# Patient Record
Sex: Female | Born: 1945
Health system: Southern US, Community
[De-identification: ages and names within clinical notes are randomized; demographics above are authoritative.]

## PROBLEM LIST (undated history)

## (undated) DIAGNOSIS — I10 Essential (primary) hypertension: Secondary | ICD-10-CM

---

## 2012-04-19 DIAGNOSIS — I10 Essential (primary) hypertension: Secondary | ICD-10-CM | POA: Diagnosis not present

## 2012-07-13 DIAGNOSIS — J209 Acute bronchitis, unspecified: Secondary | ICD-10-CM | POA: Diagnosis not present

## 2012-10-01 DIAGNOSIS — L02818 Cutaneous abscess of other sites: Secondary | ICD-10-CM | POA: Diagnosis not present

## 2012-10-01 DIAGNOSIS — L03818 Cellulitis of other sites: Secondary | ICD-10-CM | POA: Diagnosis not present

## 2013-06-02 DIAGNOSIS — I10 Essential (primary) hypertension: Secondary | ICD-10-CM | POA: Diagnosis not present

## 2013-09-08 DIAGNOSIS — Z6827 Body mass index (BMI) 27.0-27.9, adult: Secondary | ICD-10-CM | POA: Diagnosis not present

## 2013-09-08 DIAGNOSIS — I10 Essential (primary) hypertension: Secondary | ICD-10-CM | POA: Diagnosis not present

## 2013-12-12 DIAGNOSIS — Z0000 Encounter for general adult medical examination without abnormal findings: Secondary | ICD-10-CM | POA: Diagnosis not present

## 2013-12-12 DIAGNOSIS — Z6827 Body mass index (BMI) 27.0-27.9, adult: Secondary | ICD-10-CM | POA: Diagnosis not present

## 2013-12-12 DIAGNOSIS — I10 Essential (primary) hypertension: Secondary | ICD-10-CM | POA: Diagnosis not present

## 2013-12-12 DIAGNOSIS — Z131 Encounter for screening for diabetes mellitus: Secondary | ICD-10-CM | POA: Diagnosis not present

## 2015-11-22 DIAGNOSIS — I10 Essential (primary) hypertension: Secondary | ICD-10-CM | POA: Diagnosis not present

## 2015-11-22 DIAGNOSIS — Z1389 Encounter for screening for other disorder: Secondary | ICD-10-CM | POA: Diagnosis not present

## 2015-11-22 DIAGNOSIS — Z0000 Encounter for general adult medical examination without abnormal findings: Secondary | ICD-10-CM | POA: Diagnosis not present

## 2016-02-29 DIAGNOSIS — K210 Gastro-esophageal reflux disease with esophagitis: Secondary | ICD-10-CM | POA: Diagnosis not present

## 2016-02-29 DIAGNOSIS — I10 Essential (primary) hypertension: Secondary | ICD-10-CM | POA: Diagnosis not present

## 2016-12-31 DIAGNOSIS — E876 Hypokalemia: Secondary | ICD-10-CM | POA: Diagnosis not present

## 2016-12-31 DIAGNOSIS — I639 Cerebral infarction, unspecified: Secondary | ICD-10-CM | POA: Diagnosis not present

## 2016-12-31 DIAGNOSIS — G510 Bell's palsy: Secondary | ICD-10-CM | POA: Diagnosis not present

## 2016-12-31 DIAGNOSIS — D539 Nutritional anemia, unspecified: Secondary | ICD-10-CM | POA: Diagnosis not present

## 2016-12-31 DIAGNOSIS — I10 Essential (primary) hypertension: Secondary | ICD-10-CM | POA: Diagnosis not present

## 2016-12-31 DIAGNOSIS — R4781 Slurred speech: Secondary | ICD-10-CM | POA: Diagnosis not present

## 2016-12-31 DIAGNOSIS — I613 Nontraumatic intracerebral hemorrhage in brain stem: Secondary | ICD-10-CM | POA: Diagnosis not present

## 2016-12-31 DIAGNOSIS — G8194 Hemiplegia, unspecified affecting left nondominant side: Secondary | ICD-10-CM | POA: Diagnosis not present

## 2016-12-31 DIAGNOSIS — I160 Hypertensive urgency: Secondary | ICD-10-CM | POA: Diagnosis not present

## 2016-12-31 DIAGNOSIS — R29810 Facial weakness: Secondary | ICD-10-CM | POA: Diagnosis not present

## 2016-12-31 DIAGNOSIS — I615 Nontraumatic intracerebral hemorrhage, intraventricular: Secondary | ICD-10-CM | POA: Diagnosis not present

## 2016-12-31 DIAGNOSIS — I161 Hypertensive emergency: Secondary | ICD-10-CM | POA: Diagnosis not present

## 2016-12-31 DIAGNOSIS — F329 Major depressive disorder, single episode, unspecified: Secondary | ICD-10-CM | POA: Diagnosis not present

## 2016-12-31 DIAGNOSIS — R29818 Other symptoms and signs involving the nervous system: Secondary | ICD-10-CM | POA: Diagnosis not present

## 2016-12-31 DIAGNOSIS — I6789 Other cerebrovascular disease: Secondary | ICD-10-CM | POA: Diagnosis not present

## 2017-01-01 DIAGNOSIS — I10 Essential (primary) hypertension: Secondary | ICD-10-CM | POA: Diagnosis not present

## 2017-01-01 DIAGNOSIS — I613 Nontraumatic intracerebral hemorrhage in brain stem: Secondary | ICD-10-CM | POA: Diagnosis not present

## 2017-01-01 DIAGNOSIS — G529 Cranial nerve disorder, unspecified: Secondary | ICD-10-CM | POA: Diagnosis not present

## 2017-01-01 DIAGNOSIS — K5900 Constipation, unspecified: Secondary | ICD-10-CM | POA: Diagnosis not present

## 2017-01-01 DIAGNOSIS — H02401 Unspecified ptosis of right eyelid: Secondary | ICD-10-CM | POA: Diagnosis present

## 2017-01-01 DIAGNOSIS — R079 Chest pain, unspecified: Secondary | ICD-10-CM | POA: Diagnosis not present

## 2017-01-01 DIAGNOSIS — D649 Anemia, unspecified: Secondary | ICD-10-CM | POA: Diagnosis not present

## 2017-01-01 DIAGNOSIS — F329 Major depressive disorder, single episode, unspecified: Secondary | ICD-10-CM | POA: Diagnosis not present

## 2017-01-01 DIAGNOSIS — I6781 Acute cerebrovascular insufficiency: Secondary | ICD-10-CM | POA: Diagnosis not present

## 2017-01-01 DIAGNOSIS — R29810 Facial weakness: Secondary | ICD-10-CM | POA: Diagnosis not present

## 2017-01-01 DIAGNOSIS — D539 Nutritional anemia, unspecified: Secondary | ICD-10-CM | POA: Diagnosis present

## 2017-01-01 DIAGNOSIS — E876 Hypokalemia: Secondary | ICD-10-CM | POA: Diagnosis not present

## 2017-01-01 DIAGNOSIS — R9431 Abnormal electrocardiogram [ECG] [EKG]: Secondary | ICD-10-CM | POA: Diagnosis not present

## 2017-01-01 DIAGNOSIS — H538 Other visual disturbances: Secondary | ICD-10-CM | POA: Diagnosis not present

## 2017-01-01 DIAGNOSIS — R41841 Cognitive communication deficit: Secondary | ICD-10-CM | POA: Diagnosis not present

## 2017-01-01 DIAGNOSIS — R1310 Dysphagia, unspecified: Secondary | ICD-10-CM | POA: Diagnosis not present

## 2017-01-01 DIAGNOSIS — I615 Nontraumatic intracerebral hemorrhage, intraventricular: Secondary | ICD-10-CM | POA: Diagnosis not present

## 2017-01-01 DIAGNOSIS — Z7409 Other reduced mobility: Secondary | ICD-10-CM | POA: Diagnosis not present

## 2017-01-01 DIAGNOSIS — Z9114 Patient's other noncompliance with medication regimen: Secondary | ICD-10-CM | POA: Diagnosis not present

## 2017-01-01 DIAGNOSIS — I161 Hypertensive emergency: Secondary | ICD-10-CM | POA: Diagnosis not present

## 2017-01-01 DIAGNOSIS — G510 Bell's palsy: Secondary | ICD-10-CM | POA: Diagnosis present

## 2017-01-01 DIAGNOSIS — H510 Palsy (spasm) of conjugate gaze: Secondary | ICD-10-CM | POA: Diagnosis not present

## 2017-01-07 DIAGNOSIS — F329 Major depressive disorder, single episode, unspecified: Secondary | ICD-10-CM | POA: Diagnosis present

## 2017-01-07 DIAGNOSIS — I613 Nontraumatic intracerebral hemorrhage in brain stem: Secondary | ICD-10-CM | POA: Diagnosis not present

## 2017-01-07 DIAGNOSIS — I10 Essential (primary) hypertension: Secondary | ICD-10-CM | POA: Diagnosis not present

## 2017-01-07 DIAGNOSIS — H02206 Unspecified lagophthalmos left eye, unspecified eyelid: Secondary | ICD-10-CM | POA: Diagnosis not present

## 2017-01-07 DIAGNOSIS — I69198 Other sequelae of nontraumatic intracerebral hemorrhage: Secondary | ICD-10-CM | POA: Diagnosis not present

## 2017-01-07 DIAGNOSIS — R41841 Cognitive communication deficit: Secondary | ICD-10-CM | POA: Diagnosis not present

## 2017-01-07 DIAGNOSIS — I69193 Ataxia following nontraumatic intracerebral hemorrhage: Secondary | ICD-10-CM | POA: Diagnosis not present

## 2017-01-07 DIAGNOSIS — I69134 Monoplegia of upper limb following nontraumatic intracerebral hemorrhage affecting left non-dominant side: Secondary | ICD-10-CM | POA: Diagnosis not present

## 2017-01-07 DIAGNOSIS — H547 Unspecified visual loss: Secondary | ICD-10-CM | POA: Diagnosis present

## 2017-01-07 DIAGNOSIS — H02401 Unspecified ptosis of right eyelid: Secondary | ICD-10-CM | POA: Diagnosis present

## 2017-01-07 DIAGNOSIS — R079 Chest pain, unspecified: Secondary | ICD-10-CM | POA: Diagnosis not present

## 2017-01-07 DIAGNOSIS — H4922 Sixth [abducent] nerve palsy, left eye: Secondary | ICD-10-CM | POA: Diagnosis present

## 2017-01-07 DIAGNOSIS — G510 Bell's palsy: Secondary | ICD-10-CM | POA: Diagnosis present

## 2017-01-07 DIAGNOSIS — R1310 Dysphagia, unspecified: Secondary | ICD-10-CM | POA: Diagnosis not present

## 2017-01-07 DIAGNOSIS — I69192 Facial weakness following nontraumatic intracerebral hemorrhage: Secondary | ICD-10-CM | POA: Diagnosis not present

## 2017-01-07 DIAGNOSIS — I69191 Dysphagia following nontraumatic intracerebral hemorrhage: Secondary | ICD-10-CM | POA: Diagnosis not present

## 2017-01-07 DIAGNOSIS — R419 Unspecified symptoms and signs involving cognitive functions and awareness: Secondary | ICD-10-CM | POA: Diagnosis not present

## 2017-01-07 DIAGNOSIS — E876 Hypokalemia: Secondary | ICD-10-CM | POA: Diagnosis not present

## 2017-01-07 DIAGNOSIS — H538 Other visual disturbances: Secondary | ICD-10-CM | POA: Diagnosis present

## 2017-01-07 DIAGNOSIS — I69118 Other symptoms and signs involving cognitive functions following nontraumatic intracerebral hemorrhage: Secondary | ICD-10-CM | POA: Diagnosis not present

## 2017-01-07 DIAGNOSIS — D649 Anemia, unspecified: Secondary | ICD-10-CM | POA: Diagnosis not present

## 2017-01-07 DIAGNOSIS — Z7409 Other reduced mobility: Secondary | ICD-10-CM | POA: Diagnosis not present

## 2017-01-07 DIAGNOSIS — I6911 Attention and concentration deficit following nontraumatic intracerebral hemorrhage: Secondary | ICD-10-CM | POA: Diagnosis not present

## 2017-01-07 DIAGNOSIS — K5900 Constipation, unspecified: Secondary | ICD-10-CM | POA: Diagnosis not present

## 2017-01-07 DIAGNOSIS — K2900 Acute gastritis without bleeding: Secondary | ICD-10-CM | POA: Diagnosis not present

## 2017-01-07 DIAGNOSIS — R414 Neurologic neglect syndrome: Secondary | ICD-10-CM | POA: Diagnosis present

## 2017-01-07 DIAGNOSIS — R0789 Other chest pain: Secondary | ICD-10-CM | POA: Diagnosis present

## 2017-01-07 DIAGNOSIS — K219 Gastro-esophageal reflux disease without esophagitis: Secondary | ICD-10-CM | POA: Diagnosis not present

## 2017-01-07 DIAGNOSIS — I69122 Dysarthria following nontraumatic intracerebral hemorrhage: Secondary | ICD-10-CM | POA: Diagnosis not present

## 2017-01-07 DIAGNOSIS — I615 Nontraumatic intracerebral hemorrhage, intraventricular: Secondary | ICD-10-CM | POA: Diagnosis not present

## 2017-01-20 DIAGNOSIS — H547 Unspecified visual loss: Secondary | ICD-10-CM | POA: Diagnosis not present

## 2017-01-20 DIAGNOSIS — I10 Essential (primary) hypertension: Secondary | ICD-10-CM | POA: Diagnosis not present

## 2017-01-20 DIAGNOSIS — I69191 Dysphagia following nontraumatic intracerebral hemorrhage: Secondary | ICD-10-CM | POA: Diagnosis not present

## 2017-01-20 DIAGNOSIS — R1310 Dysphagia, unspecified: Secondary | ICD-10-CM | POA: Diagnosis not present

## 2017-01-20 DIAGNOSIS — I69118 Other symptoms and signs involving cognitive functions following nontraumatic intracerebral hemorrhage: Secondary | ICD-10-CM | POA: Diagnosis not present

## 2017-01-20 DIAGNOSIS — I69198 Other sequelae of nontraumatic intracerebral hemorrhage: Secondary | ICD-10-CM | POA: Diagnosis not present

## 2017-01-21 DIAGNOSIS — I69198 Other sequelae of nontraumatic intracerebral hemorrhage: Secondary | ICD-10-CM | POA: Diagnosis not present

## 2017-01-21 DIAGNOSIS — I10 Essential (primary) hypertension: Secondary | ICD-10-CM | POA: Diagnosis not present

## 2017-01-21 DIAGNOSIS — H547 Unspecified visual loss: Secondary | ICD-10-CM | POA: Diagnosis not present

## 2017-01-21 DIAGNOSIS — I69191 Dysphagia following nontraumatic intracerebral hemorrhage: Secondary | ICD-10-CM | POA: Diagnosis not present

## 2017-01-21 DIAGNOSIS — R1310 Dysphagia, unspecified: Secondary | ICD-10-CM | POA: Diagnosis not present

## 2017-01-21 DIAGNOSIS — I69118 Other symptoms and signs involving cognitive functions following nontraumatic intracerebral hemorrhage: Secondary | ICD-10-CM | POA: Diagnosis not present

## 2017-01-23 DIAGNOSIS — K210 Gastro-esophageal reflux disease with esophagitis: Secondary | ICD-10-CM | POA: Diagnosis not present

## 2017-01-23 DIAGNOSIS — Z1389 Encounter for screening for other disorder: Secondary | ICD-10-CM | POA: Diagnosis not present

## 2017-01-23 DIAGNOSIS — I10 Essential (primary) hypertension: Secondary | ICD-10-CM | POA: Diagnosis not present

## 2017-01-23 DIAGNOSIS — Z0000 Encounter for general adult medical examination without abnormal findings: Secondary | ICD-10-CM | POA: Diagnosis not present

## 2017-01-23 DIAGNOSIS — I6349 Cerebral infarction due to embolism of other cerebral artery: Secondary | ICD-10-CM | POA: Diagnosis not present

## 2017-01-27 DIAGNOSIS — I69198 Other sequelae of nontraumatic intracerebral hemorrhage: Secondary | ICD-10-CM | POA: Diagnosis not present

## 2017-01-27 DIAGNOSIS — H547 Unspecified visual loss: Secondary | ICD-10-CM | POA: Diagnosis not present

## 2017-01-27 DIAGNOSIS — I69118 Other symptoms and signs involving cognitive functions following nontraumatic intracerebral hemorrhage: Secondary | ICD-10-CM | POA: Diagnosis not present

## 2017-01-27 DIAGNOSIS — I10 Essential (primary) hypertension: Secondary | ICD-10-CM | POA: Diagnosis not present

## 2017-01-27 DIAGNOSIS — R1310 Dysphagia, unspecified: Secondary | ICD-10-CM | POA: Diagnosis not present

## 2017-01-27 DIAGNOSIS — I69191 Dysphagia following nontraumatic intracerebral hemorrhage: Secondary | ICD-10-CM | POA: Diagnosis not present

## 2017-01-30 DIAGNOSIS — I69118 Other symptoms and signs involving cognitive functions following nontraumatic intracerebral hemorrhage: Secondary | ICD-10-CM | POA: Diagnosis not present

## 2017-01-30 DIAGNOSIS — I69198 Other sequelae of nontraumatic intracerebral hemorrhage: Secondary | ICD-10-CM | POA: Diagnosis not present

## 2017-01-30 DIAGNOSIS — H547 Unspecified visual loss: Secondary | ICD-10-CM | POA: Diagnosis not present

## 2017-01-30 DIAGNOSIS — I69191 Dysphagia following nontraumatic intracerebral hemorrhage: Secondary | ICD-10-CM | POA: Diagnosis not present

## 2017-01-30 DIAGNOSIS — I10 Essential (primary) hypertension: Secondary | ICD-10-CM | POA: Diagnosis not present

## 2017-01-30 DIAGNOSIS — R1310 Dysphagia, unspecified: Secondary | ICD-10-CM | POA: Diagnosis not present

## 2017-02-03 DIAGNOSIS — I10 Essential (primary) hypertension: Secondary | ICD-10-CM | POA: Diagnosis not present

## 2017-02-03 DIAGNOSIS — I69191 Dysphagia following nontraumatic intracerebral hemorrhage: Secondary | ICD-10-CM | POA: Diagnosis not present

## 2017-02-03 DIAGNOSIS — I69118 Other symptoms and signs involving cognitive functions following nontraumatic intracerebral hemorrhage: Secondary | ICD-10-CM | POA: Diagnosis not present

## 2017-02-03 DIAGNOSIS — I69198 Other sequelae of nontraumatic intracerebral hemorrhage: Secondary | ICD-10-CM | POA: Diagnosis not present

## 2017-02-03 DIAGNOSIS — R1310 Dysphagia, unspecified: Secondary | ICD-10-CM | POA: Diagnosis not present

## 2017-02-03 DIAGNOSIS — H547 Unspecified visual loss: Secondary | ICD-10-CM | POA: Diagnosis not present

## 2017-02-04 DIAGNOSIS — R1310 Dysphagia, unspecified: Secondary | ICD-10-CM | POA: Diagnosis not present

## 2017-02-04 DIAGNOSIS — I69118 Other symptoms and signs involving cognitive functions following nontraumatic intracerebral hemorrhage: Secondary | ICD-10-CM | POA: Diagnosis not present

## 2017-02-04 DIAGNOSIS — I10 Essential (primary) hypertension: Secondary | ICD-10-CM | POA: Diagnosis not present

## 2017-02-04 DIAGNOSIS — I69191 Dysphagia following nontraumatic intracerebral hemorrhage: Secondary | ICD-10-CM | POA: Diagnosis not present

## 2017-02-04 DIAGNOSIS — I69198 Other sequelae of nontraumatic intracerebral hemorrhage: Secondary | ICD-10-CM | POA: Diagnosis not present

## 2017-02-04 DIAGNOSIS — H547 Unspecified visual loss: Secondary | ICD-10-CM | POA: Diagnosis not present

## 2017-02-05 DIAGNOSIS — I69198 Other sequelae of nontraumatic intracerebral hemorrhage: Secondary | ICD-10-CM | POA: Diagnosis not present

## 2017-02-05 DIAGNOSIS — R1310 Dysphagia, unspecified: Secondary | ICD-10-CM | POA: Diagnosis not present

## 2017-02-05 DIAGNOSIS — I10 Essential (primary) hypertension: Secondary | ICD-10-CM | POA: Diagnosis not present

## 2017-02-05 DIAGNOSIS — I69191 Dysphagia following nontraumatic intracerebral hemorrhage: Secondary | ICD-10-CM | POA: Diagnosis not present

## 2017-02-05 DIAGNOSIS — H547 Unspecified visual loss: Secondary | ICD-10-CM | POA: Diagnosis not present

## 2017-02-05 DIAGNOSIS — I69118 Other symptoms and signs involving cognitive functions following nontraumatic intracerebral hemorrhage: Secondary | ICD-10-CM | POA: Diagnosis not present

## 2017-02-10 DIAGNOSIS — I10 Essential (primary) hypertension: Secondary | ICD-10-CM | POA: Diagnosis not present

## 2017-02-10 DIAGNOSIS — Z7409 Other reduced mobility: Secondary | ICD-10-CM | POA: Diagnosis not present

## 2017-02-10 DIAGNOSIS — R4189 Other symptoms and signs involving cognitive functions and awareness: Secondary | ICD-10-CM | POA: Diagnosis not present

## 2017-02-10 DIAGNOSIS — R269 Unspecified abnormalities of gait and mobility: Secondary | ICD-10-CM | POA: Diagnosis not present

## 2017-02-10 DIAGNOSIS — Z79899 Other long term (current) drug therapy: Secondary | ICD-10-CM | POA: Diagnosis not present

## 2017-02-10 DIAGNOSIS — R29818 Other symptoms and signs involving the nervous system: Secondary | ICD-10-CM | POA: Diagnosis not present

## 2017-02-11 DIAGNOSIS — I69118 Other symptoms and signs involving cognitive functions following nontraumatic intracerebral hemorrhage: Secondary | ICD-10-CM | POA: Diagnosis not present

## 2017-02-11 DIAGNOSIS — I69191 Dysphagia following nontraumatic intracerebral hemorrhage: Secondary | ICD-10-CM | POA: Diagnosis not present

## 2017-02-11 DIAGNOSIS — R1310 Dysphagia, unspecified: Secondary | ICD-10-CM | POA: Diagnosis not present

## 2017-02-11 DIAGNOSIS — I69198 Other sequelae of nontraumatic intracerebral hemorrhage: Secondary | ICD-10-CM | POA: Diagnosis not present

## 2017-02-11 DIAGNOSIS — I10 Essential (primary) hypertension: Secondary | ICD-10-CM | POA: Diagnosis not present

## 2017-02-11 DIAGNOSIS — H547 Unspecified visual loss: Secondary | ICD-10-CM | POA: Diagnosis not present

## 2017-02-12 DIAGNOSIS — H547 Unspecified visual loss: Secondary | ICD-10-CM | POA: Diagnosis not present

## 2017-02-12 DIAGNOSIS — I69118 Other symptoms and signs involving cognitive functions following nontraumatic intracerebral hemorrhage: Secondary | ICD-10-CM | POA: Diagnosis not present

## 2017-02-12 DIAGNOSIS — I69198 Other sequelae of nontraumatic intracerebral hemorrhage: Secondary | ICD-10-CM | POA: Diagnosis not present

## 2017-02-12 DIAGNOSIS — I69191 Dysphagia following nontraumatic intracerebral hemorrhage: Secondary | ICD-10-CM | POA: Diagnosis not present

## 2017-02-12 DIAGNOSIS — I10 Essential (primary) hypertension: Secondary | ICD-10-CM | POA: Diagnosis not present

## 2017-02-12 DIAGNOSIS — R1310 Dysphagia, unspecified: Secondary | ICD-10-CM | POA: Diagnosis not present

## 2017-02-13 DIAGNOSIS — I10 Essential (primary) hypertension: Secondary | ICD-10-CM | POA: Diagnosis not present

## 2017-02-13 DIAGNOSIS — I69191 Dysphagia following nontraumatic intracerebral hemorrhage: Secondary | ICD-10-CM | POA: Diagnosis not present

## 2017-02-13 DIAGNOSIS — R1310 Dysphagia, unspecified: Secondary | ICD-10-CM | POA: Diagnosis not present

## 2017-02-13 DIAGNOSIS — H547 Unspecified visual loss: Secondary | ICD-10-CM | POA: Diagnosis not present

## 2017-02-13 DIAGNOSIS — I69118 Other symptoms and signs involving cognitive functions following nontraumatic intracerebral hemorrhage: Secondary | ICD-10-CM | POA: Diagnosis not present

## 2017-02-13 DIAGNOSIS — I69198 Other sequelae of nontraumatic intracerebral hemorrhage: Secondary | ICD-10-CM | POA: Diagnosis not present

## 2017-04-06 DIAGNOSIS — H02206 Unspecified lagophthalmos left eye, unspecified eyelid: Secondary | ICD-10-CM | POA: Diagnosis not present

## 2017-04-06 DIAGNOSIS — H4922 Sixth [abducent] nerve palsy, left eye: Secondary | ICD-10-CM | POA: Diagnosis not present

## 2017-04-06 DIAGNOSIS — H40003 Preglaucoma, unspecified, bilateral: Secondary | ICD-10-CM | POA: Diagnosis not present

## 2017-04-06 DIAGNOSIS — I10 Essential (primary) hypertension: Secondary | ICD-10-CM | POA: Diagnosis not present

## 2017-04-06 DIAGNOSIS — Z8673 Personal history of transient ischemic attack (TIA), and cerebral infarction without residual deficits: Secondary | ICD-10-CM | POA: Diagnosis not present

## 2017-04-06 DIAGNOSIS — Z79899 Other long term (current) drug therapy: Secondary | ICD-10-CM | POA: Diagnosis not present

## 2017-04-06 DIAGNOSIS — I613 Nontraumatic intracerebral hemorrhage in brain stem: Secondary | ICD-10-CM | POA: Diagnosis not present

## 2017-05-05 DIAGNOSIS — Z131 Encounter for screening for diabetes mellitus: Secondary | ICD-10-CM | POA: Diagnosis not present

## 2017-05-05 DIAGNOSIS — I10 Essential (primary) hypertension: Secondary | ICD-10-CM | POA: Diagnosis not present

## 2017-05-05 DIAGNOSIS — I6349 Cerebral infarction due to embolism of other cerebral artery: Secondary | ICD-10-CM | POA: Diagnosis not present

## 2017-05-05 DIAGNOSIS — K210 Gastro-esophageal reflux disease with esophagitis: Secondary | ICD-10-CM | POA: Diagnosis not present

## 2017-05-22 DIAGNOSIS — H16212 Exposure keratoconjunctivitis, left eye: Secondary | ICD-10-CM | POA: Diagnosis not present

## 2017-05-22 DIAGNOSIS — I613 Nontraumatic intracerebral hemorrhage in brain stem: Secondary | ICD-10-CM | POA: Diagnosis not present

## 2017-05-22 DIAGNOSIS — H40003 Preglaucoma, unspecified, bilateral: Secondary | ICD-10-CM | POA: Diagnosis not present

## 2017-05-22 DIAGNOSIS — H4922 Sixth [abducent] nerve palsy, left eye: Secondary | ICD-10-CM | POA: Diagnosis not present

## 2017-05-22 DIAGNOSIS — H02206 Unspecified lagophthalmos left eye, unspecified eyelid: Secondary | ICD-10-CM | POA: Diagnosis not present

## 2017-06-01 DIAGNOSIS — M810 Age-related osteoporosis without current pathological fracture: Secondary | ICD-10-CM | POA: Diagnosis not present

## 2017-06-01 DIAGNOSIS — M85852 Other specified disorders of bone density and structure, left thigh: Secondary | ICD-10-CM | POA: Diagnosis not present

## 2017-08-11 DIAGNOSIS — I10 Essential (primary) hypertension: Secondary | ICD-10-CM | POA: Diagnosis not present

## 2017-08-11 DIAGNOSIS — I6349 Cerebral infarction due to embolism of other cerebral artery: Secondary | ICD-10-CM | POA: Diagnosis not present

## 2017-08-11 DIAGNOSIS — K210 Gastro-esophageal reflux disease with esophagitis: Secondary | ICD-10-CM | POA: Diagnosis not present

## 2017-09-22 DIAGNOSIS — H16212 Exposure keratoconjunctivitis, left eye: Secondary | ICD-10-CM | POA: Diagnosis not present

## 2017-09-22 DIAGNOSIS — H40003 Preglaucoma, unspecified, bilateral: Secondary | ICD-10-CM | POA: Diagnosis not present

## 2017-09-22 DIAGNOSIS — H02206 Unspecified lagophthalmos left eye, unspecified eyelid: Secondary | ICD-10-CM | POA: Diagnosis not present

## 2017-09-22 DIAGNOSIS — I10 Essential (primary) hypertension: Secondary | ICD-10-CM | POA: Diagnosis not present

## 2017-09-22 DIAGNOSIS — I613 Nontraumatic intracerebral hemorrhage in brain stem: Secondary | ICD-10-CM | POA: Diagnosis not present

## 2017-09-22 DIAGNOSIS — Z79899 Other long term (current) drug therapy: Secondary | ICD-10-CM | POA: Diagnosis not present

## 2017-09-22 DIAGNOSIS — H4922 Sixth [abducent] nerve palsy, left eye: Secondary | ICD-10-CM | POA: Diagnosis not present

## 2017-09-22 DIAGNOSIS — H26212 Cataract with neovascularization, left eye: Secondary | ICD-10-CM | POA: Diagnosis not present

## 2017-09-22 DIAGNOSIS — Z8673 Personal history of transient ischemic attack (TIA), and cerebral infarction without residual deficits: Secondary | ICD-10-CM | POA: Diagnosis not present

## 2017-11-19 DIAGNOSIS — I10 Essential (primary) hypertension: Secondary | ICD-10-CM | POA: Diagnosis not present

## 2017-11-19 DIAGNOSIS — K210 Gastro-esophageal reflux disease with esophagitis: Secondary | ICD-10-CM | POA: Diagnosis not present

## 2017-11-19 DIAGNOSIS — I6349 Cerebral infarction due to embolism of other cerebral artery: Secondary | ICD-10-CM | POA: Diagnosis not present

## 2017-11-25 DIAGNOSIS — H532 Diplopia: Secondary | ICD-10-CM | POA: Diagnosis not present

## 2017-11-25 DIAGNOSIS — Z8673 Personal history of transient ischemic attack (TIA), and cerebral infarction without residual deficits: Secondary | ICD-10-CM | POA: Diagnosis not present

## 2017-11-25 DIAGNOSIS — H5000 Unspecified esotropia: Secondary | ICD-10-CM | POA: Diagnosis not present

## 2017-11-25 DIAGNOSIS — H510 Palsy (spasm) of conjugate gaze: Secondary | ICD-10-CM | POA: Diagnosis not present

## 2017-11-25 DIAGNOSIS — I10 Essential (primary) hypertension: Secondary | ICD-10-CM | POA: Diagnosis not present

## 2017-11-25 DIAGNOSIS — H269 Unspecified cataract: Secondary | ICD-10-CM | POA: Diagnosis not present

## 2017-11-25 DIAGNOSIS — H4922 Sixth [abducent] nerve palsy, left eye: Secondary | ICD-10-CM | POA: Diagnosis not present

## 2017-11-25 DIAGNOSIS — G510 Bell's palsy: Secondary | ICD-10-CM | POA: Diagnosis not present

## 2017-12-25 DIAGNOSIS — I613 Nontraumatic intracerebral hemorrhage in brain stem: Secondary | ICD-10-CM | POA: Diagnosis not present

## 2017-12-25 DIAGNOSIS — H269 Unspecified cataract: Secondary | ICD-10-CM | POA: Diagnosis not present

## 2017-12-25 DIAGNOSIS — H25011 Cortical age-related cataract, right eye: Secondary | ICD-10-CM | POA: Diagnosis not present

## 2017-12-25 DIAGNOSIS — H5000 Unspecified esotropia: Secondary | ICD-10-CM | POA: Diagnosis not present

## 2017-12-25 DIAGNOSIS — H510 Palsy (spasm) of conjugate gaze: Secondary | ICD-10-CM | POA: Diagnosis not present

## 2017-12-25 DIAGNOSIS — Z8673 Personal history of transient ischemic attack (TIA), and cerebral infarction without residual deficits: Secondary | ICD-10-CM | POA: Diagnosis not present

## 2017-12-25 DIAGNOSIS — H4922 Sixth [abducent] nerve palsy, left eye: Secondary | ICD-10-CM | POA: Diagnosis not present

## 2017-12-25 DIAGNOSIS — G510 Bell's palsy: Secondary | ICD-10-CM | POA: Diagnosis not present

## 2017-12-25 DIAGNOSIS — H532 Diplopia: Secondary | ICD-10-CM | POA: Diagnosis not present

## 2017-12-25 DIAGNOSIS — H189 Unspecified disorder of cornea: Secondary | ICD-10-CM | POA: Diagnosis not present

## 2018-01-19 DIAGNOSIS — H269 Unspecified cataract: Secondary | ICD-10-CM | POA: Diagnosis not present

## 2018-01-19 DIAGNOSIS — H25011 Cortical age-related cataract, right eye: Secondary | ICD-10-CM | POA: Diagnosis not present

## 2018-01-28 DIAGNOSIS — H2511 Age-related nuclear cataract, right eye: Secondary | ICD-10-CM | POA: Diagnosis not present

## 2018-01-28 DIAGNOSIS — Z7982 Long term (current) use of aspirin: Secondary | ICD-10-CM | POA: Diagnosis not present

## 2018-01-28 DIAGNOSIS — I10 Essential (primary) hypertension: Secondary | ICD-10-CM | POA: Diagnosis not present

## 2018-01-28 DIAGNOSIS — H2512 Age-related nuclear cataract, left eye: Secondary | ICD-10-CM | POA: Diagnosis not present

## 2018-01-28 DIAGNOSIS — Z79899 Other long term (current) drug therapy: Secondary | ICD-10-CM | POA: Diagnosis not present

## 2018-01-28 DIAGNOSIS — Z8673 Personal history of transient ischemic attack (TIA), and cerebral infarction without residual deficits: Secondary | ICD-10-CM | POA: Diagnosis not present

## 2018-01-29 DIAGNOSIS — Z961 Presence of intraocular lens: Secondary | ICD-10-CM | POA: Diagnosis not present

## 2018-01-29 DIAGNOSIS — H2512 Age-related nuclear cataract, left eye: Secondary | ICD-10-CM | POA: Diagnosis not present

## 2018-01-29 DIAGNOSIS — H4922 Sixth [abducent] nerve palsy, left eye: Secondary | ICD-10-CM | POA: Diagnosis not present

## 2018-01-29 DIAGNOSIS — Z8673 Personal history of transient ischemic attack (TIA), and cerebral infarction without residual deficits: Secondary | ICD-10-CM | POA: Diagnosis not present

## 2018-01-29 DIAGNOSIS — Z9841 Cataract extraction status, right eye: Secondary | ICD-10-CM | POA: Diagnosis not present

## 2018-02-15 DIAGNOSIS — H4922 Sixth [abducent] nerve palsy, left eye: Secondary | ICD-10-CM | POA: Diagnosis not present

## 2018-02-15 DIAGNOSIS — Z8673 Personal history of transient ischemic attack (TIA), and cerebral infarction without residual deficits: Secondary | ICD-10-CM | POA: Diagnosis not present

## 2018-02-15 DIAGNOSIS — Z9841 Cataract extraction status, right eye: Secondary | ICD-10-CM | POA: Diagnosis not present

## 2018-02-15 DIAGNOSIS — Z961 Presence of intraocular lens: Secondary | ICD-10-CM | POA: Diagnosis not present

## 2018-02-15 DIAGNOSIS — H510 Palsy (spasm) of conjugate gaze: Secondary | ICD-10-CM | POA: Diagnosis not present

## 2018-02-15 DIAGNOSIS — H2512 Age-related nuclear cataract, left eye: Secondary | ICD-10-CM | POA: Diagnosis not present

## 2018-02-25 DIAGNOSIS — I10 Essential (primary) hypertension: Secondary | ICD-10-CM | POA: Diagnosis not present

## 2018-02-25 DIAGNOSIS — I6349 Cerebral infarction due to embolism of other cerebral artery: Secondary | ICD-10-CM | POA: Diagnosis not present

## 2018-02-25 DIAGNOSIS — K210 Gastro-esophageal reflux disease with esophagitis: Secondary | ICD-10-CM | POA: Diagnosis not present

## 2018-02-25 DIAGNOSIS — E7849 Other hyperlipidemia: Secondary | ICD-10-CM | POA: Diagnosis not present

## 2018-03-12 DIAGNOSIS — Z48810 Encounter for surgical aftercare following surgery on the sense organs: Secondary | ICD-10-CM | POA: Diagnosis not present

## 2018-03-12 DIAGNOSIS — Z91128 Patient's intentional underdosing of medication regimen for other reason: Secondary | ICD-10-CM | POA: Diagnosis not present

## 2018-03-12 DIAGNOSIS — H510 Palsy (spasm) of conjugate gaze: Secondary | ICD-10-CM | POA: Diagnosis not present

## 2018-03-12 DIAGNOSIS — G510 Bell's palsy: Secondary | ICD-10-CM | POA: Diagnosis not present

## 2018-03-12 DIAGNOSIS — H4922 Sixth [abducent] nerve palsy, left eye: Secondary | ICD-10-CM | POA: Diagnosis not present

## 2018-03-12 DIAGNOSIS — H532 Diplopia: Secondary | ICD-10-CM | POA: Diagnosis not present

## 2018-03-12 DIAGNOSIS — Z9841 Cataract extraction status, right eye: Secondary | ICD-10-CM | POA: Diagnosis not present

## 2018-03-12 DIAGNOSIS — Z961 Presence of intraocular lens: Secondary | ICD-10-CM | POA: Diagnosis not present

## 2018-03-12 DIAGNOSIS — H2512 Age-related nuclear cataract, left eye: Secondary | ICD-10-CM | POA: Diagnosis not present

## 2018-06-02 DIAGNOSIS — I6349 Cerebral infarction due to embolism of other cerebral artery: Secondary | ICD-10-CM | POA: Diagnosis not present

## 2018-06-02 DIAGNOSIS — K210 Gastro-esophageal reflux disease with esophagitis: Secondary | ICD-10-CM | POA: Diagnosis not present

## 2018-06-02 DIAGNOSIS — I10 Essential (primary) hypertension: Secondary | ICD-10-CM | POA: Diagnosis not present

## 2018-06-02 DIAGNOSIS — E7849 Other hyperlipidemia: Secondary | ICD-10-CM | POA: Diagnosis not present

## 2018-06-23 DIAGNOSIS — Z8673 Personal history of transient ischemic attack (TIA), and cerebral infarction without residual deficits: Secondary | ICD-10-CM | POA: Diagnosis not present

## 2018-06-23 DIAGNOSIS — H510 Palsy (spasm) of conjugate gaze: Secondary | ICD-10-CM | POA: Diagnosis not present

## 2018-06-23 DIAGNOSIS — G510 Bell's palsy: Secondary | ICD-10-CM | POA: Diagnosis not present

## 2018-06-23 DIAGNOSIS — Z961 Presence of intraocular lens: Secondary | ICD-10-CM | POA: Diagnosis not present

## 2018-06-23 DIAGNOSIS — H2512 Age-related nuclear cataract, left eye: Secondary | ICD-10-CM | POA: Diagnosis not present

## 2018-06-23 DIAGNOSIS — Z9841 Cataract extraction status, right eye: Secondary | ICD-10-CM | POA: Diagnosis not present

## 2018-06-23 DIAGNOSIS — H25012 Cortical age-related cataract, left eye: Secondary | ICD-10-CM | POA: Diagnosis not present

## 2018-06-23 DIAGNOSIS — H4922 Sixth [abducent] nerve palsy, left eye: Secondary | ICD-10-CM | POA: Diagnosis not present

## 2018-09-13 DIAGNOSIS — K210 Gastro-esophageal reflux disease with esophagitis: Secondary | ICD-10-CM | POA: Diagnosis not present

## 2018-09-13 DIAGNOSIS — Z1389 Encounter for screening for other disorder: Secondary | ICD-10-CM | POA: Diagnosis not present

## 2018-09-13 DIAGNOSIS — Z0000 Encounter for general adult medical examination without abnormal findings: Secondary | ICD-10-CM | POA: Diagnosis not present

## 2018-09-13 DIAGNOSIS — E7849 Other hyperlipidemia: Secondary | ICD-10-CM | POA: Diagnosis not present

## 2018-09-13 DIAGNOSIS — R7303 Prediabetes: Secondary | ICD-10-CM | POA: Diagnosis not present

## 2018-09-13 DIAGNOSIS — I10 Essential (primary) hypertension: Secondary | ICD-10-CM | POA: Diagnosis not present

## 2018-09-13 DIAGNOSIS — I6349 Cerebral infarction due to embolism of other cerebral artery: Secondary | ICD-10-CM | POA: Diagnosis not present

## 2018-12-20 DIAGNOSIS — K210 Gastro-esophageal reflux disease with esophagitis: Secondary | ICD-10-CM | POA: Diagnosis not present

## 2018-12-20 DIAGNOSIS — E7849 Other hyperlipidemia: Secondary | ICD-10-CM | POA: Diagnosis not present

## 2018-12-20 DIAGNOSIS — I6349 Cerebral infarction due to embolism of other cerebral artery: Secondary | ICD-10-CM | POA: Diagnosis not present

## 2018-12-20 DIAGNOSIS — I10 Essential (primary) hypertension: Secondary | ICD-10-CM | POA: Diagnosis not present

## 2019-04-13 DIAGNOSIS — K210 Gastro-esophageal reflux disease with esophagitis: Secondary | ICD-10-CM | POA: Diagnosis not present

## 2019-04-13 DIAGNOSIS — I10 Essential (primary) hypertension: Secondary | ICD-10-CM | POA: Diagnosis not present

## 2019-04-13 DIAGNOSIS — I6349 Cerebral infarction due to embolism of other cerebral artery: Secondary | ICD-10-CM | POA: Diagnosis not present

## 2019-04-13 DIAGNOSIS — E7849 Other hyperlipidemia: Secondary | ICD-10-CM | POA: Diagnosis not present

## 2019-04-13 DIAGNOSIS — Z131 Encounter for screening for diabetes mellitus: Secondary | ICD-10-CM | POA: Diagnosis not present

## 2019-07-18 DIAGNOSIS — E7849 Other hyperlipidemia: Secondary | ICD-10-CM | POA: Diagnosis not present

## 2019-07-18 DIAGNOSIS — I10 Essential (primary) hypertension: Secondary | ICD-10-CM | POA: Diagnosis not present

## 2019-07-18 DIAGNOSIS — K210 Gastro-esophageal reflux disease with esophagitis: Secondary | ICD-10-CM | POA: Diagnosis not present

## 2019-07-18 DIAGNOSIS — I6349 Cerebral infarction due to embolism of other cerebral artery: Secondary | ICD-10-CM | POA: Diagnosis not present

## 2019-10-20 DIAGNOSIS — E7849 Other hyperlipidemia: Secondary | ICD-10-CM | POA: Diagnosis not present

## 2019-10-20 DIAGNOSIS — Z131 Encounter for screening for diabetes mellitus: Secondary | ICD-10-CM | POA: Diagnosis not present

## 2019-10-20 DIAGNOSIS — Z1389 Encounter for screening for other disorder: Secondary | ICD-10-CM | POA: Diagnosis not present

## 2019-10-20 DIAGNOSIS — Z0000 Encounter for general adult medical examination without abnormal findings: Secondary | ICD-10-CM | POA: Diagnosis not present

## 2019-10-20 DIAGNOSIS — I10 Essential (primary) hypertension: Secondary | ICD-10-CM | POA: Diagnosis not present

## 2019-10-20 DIAGNOSIS — K219 Gastro-esophageal reflux disease without esophagitis: Secondary | ICD-10-CM | POA: Diagnosis not present

## 2019-10-20 DIAGNOSIS — I6349 Cerebral infarction due to embolism of other cerebral artery: Secondary | ICD-10-CM | POA: Diagnosis not present

## 2020-02-20 DIAGNOSIS — I10 Essential (primary) hypertension: Secondary | ICD-10-CM | POA: Diagnosis not present

## 2020-02-20 DIAGNOSIS — K2100 Gastro-esophageal reflux disease with esophagitis, without bleeding: Secondary | ICD-10-CM | POA: Diagnosis not present

## 2020-02-20 DIAGNOSIS — I6349 Cerebral infarction due to embolism of other cerebral artery: Secondary | ICD-10-CM | POA: Diagnosis not present

## 2020-02-20 DIAGNOSIS — E7849 Other hyperlipidemia: Secondary | ICD-10-CM | POA: Diagnosis not present

## 2020-05-31 DIAGNOSIS — Z8673 Personal history of transient ischemic attack (TIA), and cerebral infarction without residual deficits: Secondary | ICD-10-CM | POA: Diagnosis not present

## 2020-05-31 DIAGNOSIS — I10 Essential (primary) hypertension: Secondary | ICD-10-CM | POA: Diagnosis not present

## 2020-05-31 DIAGNOSIS — E7849 Other hyperlipidemia: Secondary | ICD-10-CM | POA: Diagnosis not present

## 2020-05-31 DIAGNOSIS — K2100 Gastro-esophageal reflux disease with esophagitis, without bleeding: Secondary | ICD-10-CM | POA: Diagnosis not present

## 2020-09-04 DIAGNOSIS — Z8673 Personal history of transient ischemic attack (TIA), and cerebral infarction without residual deficits: Secondary | ICD-10-CM | POA: Diagnosis not present

## 2020-09-04 DIAGNOSIS — K219 Gastro-esophageal reflux disease without esophagitis: Secondary | ICD-10-CM | POA: Diagnosis not present

## 2020-09-04 DIAGNOSIS — E7849 Other hyperlipidemia: Secondary | ICD-10-CM | POA: Diagnosis not present

## 2020-09-04 DIAGNOSIS — I10 Essential (primary) hypertension: Secondary | ICD-10-CM | POA: Diagnosis not present

## 2020-12-04 DIAGNOSIS — K219 Gastro-esophageal reflux disease without esophagitis: Secondary | ICD-10-CM | POA: Diagnosis not present

## 2020-12-04 DIAGNOSIS — I10 Essential (primary) hypertension: Secondary | ICD-10-CM | POA: Diagnosis not present

## 2020-12-04 DIAGNOSIS — Z8673 Personal history of transient ischemic attack (TIA), and cerebral infarction without residual deficits: Secondary | ICD-10-CM | POA: Diagnosis not present

## 2020-12-04 DIAGNOSIS — Z1331 Encounter for screening for depression: Secondary | ICD-10-CM | POA: Diagnosis not present

## 2020-12-04 DIAGNOSIS — Z0000 Encounter for general adult medical examination without abnormal findings: Secondary | ICD-10-CM | POA: Diagnosis not present

## 2020-12-04 DIAGNOSIS — E7849 Other hyperlipidemia: Secondary | ICD-10-CM | POA: Diagnosis not present

## 2020-12-20 DIAGNOSIS — J40 Bronchitis, not specified as acute or chronic: Secondary | ICD-10-CM | POA: Diagnosis not present

## 2020-12-22 ENCOUNTER — Emergency Department (HOSPITAL_COMMUNITY): Payer: Medicare Other

## 2020-12-22 ENCOUNTER — Emergency Department (HOSPITAL_COMMUNITY)
Admission: EM | Admit: 2020-12-22 | Discharge: 2020-12-23 | Disposition: A | Discharge status: Home / Self Care | Payer: Medicare Other | Attending: Emergency Medicine | Admitting: Emergency Medicine

## 2020-12-22 ENCOUNTER — Encounter (HOSPITAL_COMMUNITY): Payer: Self-pay

## 2020-12-22 DIAGNOSIS — R0602 Shortness of breath: Secondary | ICD-10-CM | POA: Diagnosis not present

## 2020-12-22 DIAGNOSIS — J9811 Atelectasis: Secondary | ICD-10-CM | POA: Insufficient documentation

## 2020-12-22 DIAGNOSIS — E785 Hyperlipidemia, unspecified: Secondary | ICD-10-CM | POA: Diagnosis not present

## 2020-12-22 DIAGNOSIS — J90 Pleural effusion, not elsewhere classified: Secondary | ICD-10-CM | POA: Diagnosis not present

## 2020-12-22 DIAGNOSIS — I10 Essential (primary) hypertension: Secondary | ICD-10-CM | POA: Diagnosis not present

## 2020-12-22 DIAGNOSIS — Z1152 Encounter for screening for COVID-19: Secondary | ICD-10-CM | POA: Diagnosis present

## 2020-12-22 DIAGNOSIS — U071 COVID-19: Secondary | ICD-10-CM | POA: Diagnosis not present

## 2020-12-22 HISTORY — DX: Essential (primary) hypertension: I10

## 2020-12-22 LAB — BASIC METABOLIC PANEL
Anion gap: 10 (ref 5–15)
BUN: 21 mg/dL (ref 8–23)
CO2: 26 mmol/L (ref 22–32)
Calcium: 8.4 mg/dL — ABNORMAL LOW (ref 8.9–10.3)
Chloride: 103 mmol/L (ref 98–111)
Creatinine, Ser: 1.5 mg/dL — ABNORMAL HIGH (ref 0.44–1.00)
GFR, Estimated: 36 mL/min — ABNORMAL LOW (ref >60)
Glucose, Bld: 141 mg/dL — ABNORMAL HIGH (ref 70–99)
Potassium: 3.7 mmol/L (ref 3.5–5.1)
Sodium: 139 mmol/L (ref 135–145)

## 2020-12-22 LAB — CBC
HCT: 33.9 % — ABNORMAL LOW (ref 36.0–46.0)
Hemoglobin: 10.4 g/dL — ABNORMAL LOW (ref 12.0–15.0)
MCH: 23.1 pg — ABNORMAL LOW (ref 26.0–34.0)
MCHC: 30.7 g/dL (ref 30.0–36.0)
MCV: 75.3 fL — ABNORMAL LOW (ref 80.0–100.0)
Platelets: 135 10*3/uL — ABNORMAL LOW (ref 150–400)
RBC: 4.5 MIL/uL (ref 3.87–5.11)
RDW: 14.4 % (ref 11.5–15.5)
WBC: 6.2 10*3/uL (ref 4.0–10.5)
nRBC: 0 % (ref 0.0–0.2)

## 2020-12-22 MED ORDER — ACETAMINOPHEN 325 MG PO TABS
650.0000 mg | ORAL_TABLET | Freq: Once | ORAL | Status: AC
  Administered 2020-12-22: 21:00:00 650 mg via ORAL
  Filled 2020-12-22: qty 2

## 2020-12-22 NOTE — ED Triage Notes (Signed)
Pt sent from [facility] for low oxygen, covid + today, oxygen is 94% on RA.

## 2020-12-23 NOTE — Discharge Instructions (Addendum)
Drink plenty of fluids and get plenty of rest.  Take Tylenol 650 mg rotated with ibuprofen 400 mg every 4 hours as needed for pain or fever.  Return to the emergency department if you develop chest pains, or difficulty breathing.  Isolate at home for the next seven days.  Your information was forwarded to the monoclonal antibody clinic.  They should get in contact you as to whether or not this infusion would be a possibility.      Person Under Monitoring Name: Jessica Mills  Location: [address] [place] [address]   Infection Prevention Recommendations for Individuals Confirmed to have, or Being Evaluated for, 2019 Novel Coronavirus (COVID-19) Infection Who Receive Care at Home  Individuals who are confirmed to have, or are being evaluated for, COVID-19 should follow the prevention steps below until a healthcare provider or local or state health department says they can return to normal activities.  Stay home except to get medical care You should restrict activities outside your home, except for getting medical care. Do not go to work, school, or public areas, and do not use public transportation or taxis.  Call ahead before visiting your doctor Before your medical appointment, call the healthcare provider and tell them that you have, or are being evaluated for, COVID-19 infection. This will help the healthcare provider's office take steps to keep other people from getting infected. Ask your healthcare provider to call the local or state health department.  Monitor your symptoms Seek prompt medical attention if your illness is worsening (e.g., difficulty breathing). Before going to your medical appointment, call the healthcare provider and tell them that you have, or are being evaluated for, COVID-19 infection. Ask your healthcare provider to call the local or state health department.  Wear a facemask You should wear a facemask that covers your nose and mouth when you  are in the same room with other people and when you visit a healthcare provider. People who live with or visit you should also wear a facemask while they are in the same room with you.  Separate yourself from other people in your home As much as possible, you should stay in a different room from other people in your home. Also, you should use a separate bathroom, if available.  Avoid sharing household items You should not share dishes, drinking glasses, cups, eating utensils, towels, bedding, or other items with other people in your home. After using these items, you should wash them thoroughly with soap and water.  Cover your coughs and sneezes Cover your mouth and nose with a tissue when you cough or sneeze, or you can cough or sneeze into your sleeve. Throw used tissues in a lined trash can, and immediately wash your hands with soap and water for at least 20 seconds or use an alcohol-based hand rub.  Wash your [employer] your hands often and thoroughly with soap and water for at least 20 seconds. You can use an alcohol-based hand sanitizer if soap and water are not available and if your hands are not visibly dirty. Avoid touching your eyes, nose, and mouth with unwashed hands.   Prevention Steps for Caregivers and Household Members of Individuals Confirmed to have, or Being Evaluated for, COVID-19 Infection Being Cared for in the Home  If you live with, or provide care at home for, a person confirmed to have, or being evaluated for, COVID-19 infection please follow these guidelines to prevent infection:  Follow healthcare provider's instructions Make sure that  you understand and can help the patient follow any healthcare provider instructions for all care.  Provide for the patient's basic needs You should help the patient with basic needs in the home and provide support for getting groceries, prescriptions, and other personal needs.  Monitor the patient's symptoms If they are  getting sicker, call his or her medical provider and tell them that the patient has, or is being evaluated for, COVID-19 infection. This will help the healthcare provider's office take steps to keep other people from getting infected. Ask the healthcare provider to call the local or state health department.  Limit the number of people who have contact with the patient If possible, have only one caregiver for the patient. Other household members should stay in another home or place of residence. If this is not possible, they should stay in another room, or be separated from the patient as much as possible. Use a separate bathroom, if available. Restrict visitors who do not have an essential need to be in the home.  Keep older adults, very young children, and other sick people away from the patient Keep older adults, very young children, and those who have compromised immune systems or chronic health conditions away from the patient. This includes people with chronic heart, lung, or kidney conditions, diabetes, and cancer.  Ensure good ventilation Make sure that shared spaces in the home have good air flow, such as from an air conditioner or an opened window, weather permitting.  Wash your hands often Wash your hands often and thoroughly with soap and water for at least 20 seconds. You can use an alcohol based hand sanitizer if soap and water are not available and if your hands are not visibly dirty. Avoid touching your eyes, nose, and mouth with unwashed hands. Use disposable paper towels to dry your hands. If not available, use dedicated cloth towels and replace them when they become wet.  Wear a facemask and gloves Wear a disposable facemask at all times in the room and gloves when you touch or have contact with the patient's blood, body fluids, and/or secretions or excretions, such as sweat, saliva, sputum, nasal mucus, vomit, urine, or feces.  Ensure the mask fits over your nose and mouth  tightly, and do not touch it during use. Throw out disposable facemasks and gloves after using them. Do not reuse. Wash your hands immediately after removing your facemask and gloves. If your personal clothing becomes contaminated, carefully remove clothing and launder. Wash your hands after handling contaminated clothing. Place all used disposable facemasks, gloves, and other waste in a lined container before disposing them with other household waste. Remove gloves and wash your hands immediately after handling these items.  Do not share dishes, glasses, or other household items with the patient Avoid sharing household items. You should not share dishes, drinking glasses, cups, eating utensils, towels, bedding, or other items with a patient who is confirmed to have, or being evaluated for, COVID-19 infection. After the person uses these items, you should wash them thoroughly with soap and water.  Wash laundry thoroughly Immediately remove and wash clothes or bedding that have blood, body fluids, and/or secretions or excretions, such as sweat, saliva, sputum, nasal mucus, vomit, urine, or feces, on them. Wear gloves when handling laundry from the patient. Read and follow directions on labels of laundry or clothing items and detergent. In general, wash and dry with the warmest temperatures recommended on the label.  Clean all areas the individual has used  often Clean all touchable surfaces, such as counters, tabletops, doorknobs, bathroom fixtures, toilets, phones, keyboards, tablets, and bedside tables, every day. Also, clean any surfaces that may have blood, body fluids, and/or secretions or excretions on them. Wear gloves when cleaning surfaces the patient has come in contact with. Use a diluted bleach solution (e.g., dilute bleach with 1 part bleach and 10 parts water) or a household disinfectant with a label that says EPA-registered for coronaviruses. To make a bleach solution at home, add 1  tablespoon of bleach to 1 quart (4 cups) of water. For a larger supply, add  cup of bleach to 1 gallon (16 cups) of water. Read labels of cleaning products and follow recommendations provided on product labels. Labels contain instructions for safe and effective use of the cleaning product including precautions you should take when applying the product, such as wearing gloves or eye protection and making sure you have good ventilation during use of the product. Remove gloves and wash hands immediately after cleaning.  Monitor yourself for signs and symptoms of illness Caregivers and household members are considered close contacts, should monitor their health, and will be asked to limit movement outside of the home to the extent possible. Follow the monitoring steps for close contacts listed on the symptom monitoring form.   ? If you have additional questions, contact your local health department or call the epidemiologist on call at [phone] (available 24/7). ? This guidance is subject to change. For the most up-to-date guidance from [facility], please refer to their website: [email or web address]

## 2020-12-23 NOTE — ED Provider Notes (Signed)
[facility] EMERGENCY DEPARTMENT Provider Note   CSN: [id number] Arrival date & time: 12/22/20  2111     History No chief complaint on file.   Jessica Mills is a 75 y.o. female.  Patient is a 75 year old female with history of hypertension. She is sent from urgent care for evaluation of Covid with low oxygen saturations. Patient has been feeling unwell and weak for the past 2 days. She went to urgent care today and had the above findings. Patient denies to me she is having any chest pain. She feels slightly short of breath. She denies any leg swelling. She denies any contacts with any Covid positive individuals.  The history is provided by the patient and a relative (Patient's son).       Past Medical History:  Diagnosis Date  . Hypertension     There are no problems to display for this patient.   History reviewed. No pertinent surgical history.   OB History   No obstetric history on file.     No family history on file.     Home Medications Prior to Admission medications   Not on File    Allergies    Patient has no known allergies.  Review of Systems   Review of Systems  All other systems reviewed and are negative.   Physical Exam Updated Vital Signs BP 116/68   Pulse 68   Temp 98.8 F (37.1 C) (Oral)   Resp 18   Ht 5\' 6"  (1.676 m)   Wt 72.6 kg   SpO2 94%   BMI 25.82 kg/m   Physical Exam Vitals and nursing note reviewed.  Constitutional:      General: She is not in acute distress.    Appearance: She is well-developed and well-nourished. She is not diaphoretic.  HENT:     Head: Normocephalic and atraumatic.  Cardiovascular:     Rate and Rhythm: Normal rate and regular rhythm.     Heart sounds: No murmur heard. No friction rub. No gallop.   Pulmonary:     Effort: Pulmonary effort is normal. No respiratory distress.     Breath sounds: Normal breath sounds. No wheezing.  Abdominal:     General: Bowel sounds are normal. There  is no distension.     Palpations: Abdomen is soft.     Tenderness: There is no abdominal tenderness.  Musculoskeletal:        General: Normal range of motion.     Cervical back: Normal range of motion and neck supple.     Right lower leg: No edema.     Left lower leg: No edema.  Skin:    General: Skin is warm and dry.  Neurological:     Mental Status: She is alert and oriented to person, place, and time.     ED Results / Procedures / Treatments   Labs (all labs ordered are listed, but only abnormal results are displayed) Labs Reviewed  CBC - Abnormal; Notable for the following components:      Result Value   Hemoglobin 10.4 (*)    HCT 33.9 (*)    MCV 75.3 (*)    MCH 23.1 (*)    Platelets 135 (*)    All other components within normal limits  BASIC METABOLIC PANEL - Abnormal; Notable for the following components:   Glucose, Bld 141 (*)    Creatinine, Ser 1.50 (*)    Calcium 8.4 (*)    GFR, Estimated 36 (*)  All other components within normal limits    EKG None  Radiology DG Chest Portable 1 View  Result Date: 12/23/2020 CLINICAL DATA:  Shortness of breath EXAM: PORTABLE CHEST 1 VIEW COMPARISON:  None. FINDINGS: Small right pleural effusion and bibasilar atelectasis. No pneumothorax. Normal cardiomediastinal contours. No focal airspace consolidation. IMPRESSION: Small right pleural effusion and bibasilar atelectasis. Electronically Signed   By: Deatra Robinson M.D.   On: 12/23/2020 00:01    Procedures Procedures (including critical care time)  Medications Ordered in ED Medications  acetaminophen (TYLENOL) tablet 650 mg (650 mg Oral Given 12/22/20 2127)    ED Course  I have reviewed the triage vital signs and the nursing notes.  Pertinent labs & imaging results that were available during my care of the patient were reviewed by me and considered in my medical decision making (see chart for details).    MDM Rules/Calculators/A&P  Patient is a 75 year old female  diagnosed with COVID-19 at the doctor's office earlier today.  She has been coughing and feeling poorly for the past 2 days.  She was noted to have had oxygen saturations of 90% in the doctor's office, however her saturations have been greater than 94% since she has been here in the ER.  She was in the waiting room for an extended period of time due to volume and staffing shortages.  Once in the exam room, her vitals are stable.  There is no hypoxia, no tachypnea, no tachycardia, and she has been afebrile.  On exam, her lungs are clear and there is no extremity edema.  Work-up shows unremarkable laboratory studies and chest x-ray shows a small right pleural effusion and bibasilar atelectasis, however no mention of multifocal pneumonia that would be suggestive of Covid pneumonia.  Patient has been ambulated in her room.  Saturations maintained 93% or greater.  I had a lengthy discussion with the son regarding disposition.  Based on oxygen saturations and vital signs, she does not meet criteria for admission and son seems comfortable with taking her home.  I have agreed to place a referral to the monoclonal antibody clinic to see if this is potentially an option.  Patient to return if symptoms worsen or change.  Final Clinical Impression(s) / ED Diagnoses Final diagnoses:  None    Rx / DC Orders ED Discharge Orders    None       Geoffery Lyons, MD 12/23/20 [phone]

## 2020-12-23 NOTE — ED Notes (Signed)
Patient verbalized understanding of discharge instructions. Opportunity for questions and answers.  

## 2020-12-23 NOTE — ED Notes (Signed)
Ambulated pts sats remained in the 90s,lowest being 93.Pt denies any dizziness, pt needs support walking.

## 2020-12-24 DIAGNOSIS — J9811 Atelectasis: Secondary | ICD-10-CM | POA: Diagnosis present

## 2020-12-24 DIAGNOSIS — J1282 Pneumonia due to coronavirus disease 2019: Secondary | ICD-10-CM | POA: Diagnosis not present

## 2020-12-24 DIAGNOSIS — N289 Disorder of kidney and ureter, unspecified: Secondary | ICD-10-CM | POA: Diagnosis not present

## 2020-12-24 DIAGNOSIS — N179 Acute kidney failure, unspecified: Secondary | ICD-10-CM | POA: Diagnosis present

## 2020-12-24 DIAGNOSIS — I700 Atherosclerosis of aorta: Secondary | ICD-10-CM | POA: Diagnosis present

## 2020-12-24 DIAGNOSIS — I10 Essential (primary) hypertension: Secondary | ICD-10-CM | POA: Diagnosis present

## 2020-12-24 DIAGNOSIS — I69392 Facial weakness following cerebral infarction: Secondary | ICD-10-CM | POA: Diagnosis not present

## 2020-12-24 DIAGNOSIS — E870 Hyperosmolality and hypernatremia: Secondary | ICD-10-CM | POA: Diagnosis not present

## 2020-12-24 DIAGNOSIS — I69398 Other sequelae of cerebral infarction: Secondary | ICD-10-CM | POA: Diagnosis not present

## 2020-12-24 DIAGNOSIS — U071 COVID-19: Secondary | ICD-10-CM | POA: Diagnosis present

## 2020-12-24 DIAGNOSIS — R011 Cardiac murmur, unspecified: Secondary | ICD-10-CM | POA: Diagnosis not present

## 2020-12-24 DIAGNOSIS — Z7982 Long term (current) use of aspirin: Secondary | ICD-10-CM | POA: Diagnosis not present

## 2020-12-24 DIAGNOSIS — I69354 Hemiplegia and hemiparesis following cerebral infarction affecting left non-dominant side: Secondary | ICD-10-CM | POA: Diagnosis not present

## 2020-12-24 DIAGNOSIS — E876 Hypokalemia: Secondary | ICD-10-CM | POA: Diagnosis present

## 2020-12-24 DIAGNOSIS — Z8673 Personal history of transient ischemic attack (TIA), and cerebral infarction without residual deficits: Secondary | ICD-10-CM | POA: Diagnosis not present

## 2020-12-24 DIAGNOSIS — J189 Pneumonia, unspecified organism: Secondary | ICD-10-CM | POA: Diagnosis not present

## 2020-12-24 DIAGNOSIS — R0902 Hypoxemia: Secondary | ICD-10-CM | POA: Diagnosis present

## 2020-12-24 DIAGNOSIS — J90 Pleural effusion, not elsewhere classified: Secondary | ICD-10-CM | POA: Diagnosis not present

## 2020-12-25 ENCOUNTER — Telehealth: Payer: Self-pay | Admitting: Nurse Practitioner

## 2020-12-25 DIAGNOSIS — U071 COVID-19: Secondary | ICD-10-CM

## 2020-12-25 NOTE — Telephone Encounter (Signed)
Called to Discuss with patient about Covid symptoms and the use of the monoclonal antibody infusion for those with mild to moderate Covid symptoms and at a high risk of hospitalization.     Pt appears to qualify for this infusion due to co-morbid conditions and/or a member of an at-risk group in accordance with the FDA Emergency Use Authorization.    Spoke to [place], Patient's son. Patient is currently hospitalized in [place] due to covid pneumonia.   Consuello Masse, DNP, AGNP-C [facility] [phone] (clinic)

## 2021-01-01 DIAGNOSIS — J1282 Pneumonia due to coronavirus disease 2019: Secondary | ICD-10-CM | POA: Diagnosis not present

## 2021-01-01 DIAGNOSIS — I10 Essential (primary) hypertension: Secondary | ICD-10-CM | POA: Diagnosis not present

## 2021-01-01 DIAGNOSIS — U071 COVID-19: Secondary | ICD-10-CM | POA: Diagnosis not present

## 2021-01-01 DIAGNOSIS — Z9181 History of falling: Secondary | ICD-10-CM | POA: Diagnosis not present

## 2021-01-01 DIAGNOSIS — Z9981 Dependence on supplemental oxygen: Secondary | ICD-10-CM | POA: Diagnosis not present

## 2021-01-01 DIAGNOSIS — Z7901 Long term (current) use of anticoagulants: Secondary | ICD-10-CM | POA: Diagnosis not present

## 2021-01-01 DIAGNOSIS — Z7982 Long term (current) use of aspirin: Secondary | ICD-10-CM | POA: Diagnosis not present

## 2021-01-02 DIAGNOSIS — Z9981 Dependence on supplemental oxygen: Secondary | ICD-10-CM | POA: Diagnosis not present

## 2021-01-02 DIAGNOSIS — Z7901 Long term (current) use of anticoagulants: Secondary | ICD-10-CM | POA: Diagnosis not present

## 2021-01-02 DIAGNOSIS — J1282 Pneumonia due to coronavirus disease 2019: Secondary | ICD-10-CM | POA: Diagnosis not present

## 2021-01-02 DIAGNOSIS — I10 Essential (primary) hypertension: Secondary | ICD-10-CM | POA: Diagnosis not present

## 2021-01-02 DIAGNOSIS — U071 COVID-19: Secondary | ICD-10-CM | POA: Diagnosis not present

## 2021-01-02 DIAGNOSIS — Z7982 Long term (current) use of aspirin: Secondary | ICD-10-CM | POA: Diagnosis not present

## 2021-01-04 DIAGNOSIS — U071 COVID-19: Secondary | ICD-10-CM | POA: Diagnosis not present

## 2021-01-04 DIAGNOSIS — Z7982 Long term (current) use of aspirin: Secondary | ICD-10-CM | POA: Diagnosis not present

## 2021-01-04 DIAGNOSIS — Z7901 Long term (current) use of anticoagulants: Secondary | ICD-10-CM | POA: Diagnosis not present

## 2021-01-04 DIAGNOSIS — Z9981 Dependence on supplemental oxygen: Secondary | ICD-10-CM | POA: Diagnosis not present

## 2021-01-04 DIAGNOSIS — J1282 Pneumonia due to coronavirus disease 2019: Secondary | ICD-10-CM | POA: Diagnosis not present

## 2021-01-04 DIAGNOSIS — I10 Essential (primary) hypertension: Secondary | ICD-10-CM | POA: Diagnosis not present

## 2021-01-11 DIAGNOSIS — Z7901 Long term (current) use of anticoagulants: Secondary | ICD-10-CM | POA: Diagnosis not present

## 2021-01-11 DIAGNOSIS — Z7982 Long term (current) use of aspirin: Secondary | ICD-10-CM | POA: Diagnosis not present

## 2021-01-11 DIAGNOSIS — J1282 Pneumonia due to coronavirus disease 2019: Secondary | ICD-10-CM | POA: Diagnosis not present

## 2021-01-11 DIAGNOSIS — U071 COVID-19: Secondary | ICD-10-CM | POA: Diagnosis not present

## 2021-01-11 DIAGNOSIS — Z9981 Dependence on supplemental oxygen: Secondary | ICD-10-CM | POA: Diagnosis not present

## 2021-01-11 DIAGNOSIS — I10 Essential (primary) hypertension: Secondary | ICD-10-CM | POA: Diagnosis not present

## 2021-01-17 DIAGNOSIS — U099 Post covid-19 condition, unspecified: Secondary | ICD-10-CM | POA: Diagnosis not present

## 2021-01-17 DIAGNOSIS — J1281 Pneumonia due to SARS-associated coronavirus: Secondary | ICD-10-CM | POA: Diagnosis not present

## 2021-01-18 DIAGNOSIS — Z7901 Long term (current) use of anticoagulants: Secondary | ICD-10-CM | POA: Diagnosis not present

## 2021-01-18 DIAGNOSIS — I10 Essential (primary) hypertension: Secondary | ICD-10-CM | POA: Diagnosis not present

## 2021-01-18 DIAGNOSIS — J1282 Pneumonia due to coronavirus disease 2019: Secondary | ICD-10-CM | POA: Diagnosis not present

## 2021-01-18 DIAGNOSIS — Z9981 Dependence on supplemental oxygen: Secondary | ICD-10-CM | POA: Diagnosis not present

## 2021-01-18 DIAGNOSIS — Z7982 Long term (current) use of aspirin: Secondary | ICD-10-CM | POA: Diagnosis not present

## 2021-01-18 DIAGNOSIS — U071 COVID-19: Secondary | ICD-10-CM | POA: Diagnosis not present

## 2021-01-22 DIAGNOSIS — H2513 Age-related nuclear cataract, bilateral: Secondary | ICD-10-CM | POA: Diagnosis not present

## 2021-01-22 DIAGNOSIS — H40033 Anatomical narrow angle, bilateral: Secondary | ICD-10-CM | POA: Diagnosis not present

## 2021-01-23 DIAGNOSIS — Z9981 Dependence on supplemental oxygen: Secondary | ICD-10-CM | POA: Diagnosis not present

## 2021-01-23 DIAGNOSIS — U071 COVID-19: Secondary | ICD-10-CM | POA: Diagnosis not present

## 2021-01-23 DIAGNOSIS — I10 Essential (primary) hypertension: Secondary | ICD-10-CM | POA: Diagnosis not present

## 2021-01-23 DIAGNOSIS — J1282 Pneumonia due to coronavirus disease 2019: Secondary | ICD-10-CM | POA: Diagnosis not present

## 2021-01-23 DIAGNOSIS — Z7901 Long term (current) use of anticoagulants: Secondary | ICD-10-CM | POA: Diagnosis not present

## 2021-01-23 DIAGNOSIS — Z7982 Long term (current) use of aspirin: Secondary | ICD-10-CM | POA: Diagnosis not present

## 2021-01-24 DIAGNOSIS — J1282 Pneumonia due to coronavirus disease 2019: Secondary | ICD-10-CM | POA: Diagnosis not present

## 2021-01-24 DIAGNOSIS — U071 COVID-19: Secondary | ICD-10-CM | POA: Diagnosis not present

## 2021-01-24 DIAGNOSIS — Z23 Encounter for immunization: Secondary | ICD-10-CM | POA: Diagnosis not present

## 2021-01-24 DIAGNOSIS — Z9981 Dependence on supplemental oxygen: Secondary | ICD-10-CM | POA: Diagnosis not present

## 2021-01-24 DIAGNOSIS — Z7901 Long term (current) use of anticoagulants: Secondary | ICD-10-CM | POA: Diagnosis not present

## 2021-01-24 DIAGNOSIS — Z7982 Long term (current) use of aspirin: Secondary | ICD-10-CM | POA: Diagnosis not present

## 2021-01-24 DIAGNOSIS — I10 Essential (primary) hypertension: Secondary | ICD-10-CM | POA: Diagnosis not present

## 2021-01-31 DIAGNOSIS — Z7982 Long term (current) use of aspirin: Secondary | ICD-10-CM | POA: Diagnosis not present

## 2021-01-31 DIAGNOSIS — Z9181 History of falling: Secondary | ICD-10-CM | POA: Diagnosis not present

## 2021-01-31 DIAGNOSIS — Z9981 Dependence on supplemental oxygen: Secondary | ICD-10-CM | POA: Diagnosis not present

## 2021-01-31 DIAGNOSIS — U071 COVID-19: Secondary | ICD-10-CM | POA: Diagnosis not present

## 2021-01-31 DIAGNOSIS — I10 Essential (primary) hypertension: Secondary | ICD-10-CM | POA: Diagnosis not present

## 2021-01-31 DIAGNOSIS — J1282 Pneumonia due to coronavirus disease 2019: Secondary | ICD-10-CM | POA: Diagnosis not present

## 2021-01-31 DIAGNOSIS — Z7901 Long term (current) use of anticoagulants: Secondary | ICD-10-CM | POA: Diagnosis not present

## 2021-02-02 DIAGNOSIS — U071 COVID-19: Secondary | ICD-10-CM | POA: Diagnosis not present

## 2021-02-02 DIAGNOSIS — Z7901 Long term (current) use of anticoagulants: Secondary | ICD-10-CM | POA: Diagnosis not present

## 2021-02-02 DIAGNOSIS — Z9981 Dependence on supplemental oxygen: Secondary | ICD-10-CM | POA: Diagnosis not present

## 2021-02-02 DIAGNOSIS — I10 Essential (primary) hypertension: Secondary | ICD-10-CM | POA: Diagnosis not present

## 2021-02-02 DIAGNOSIS — J1282 Pneumonia due to coronavirus disease 2019: Secondary | ICD-10-CM | POA: Diagnosis not present

## 2021-02-02 DIAGNOSIS — Z7982 Long term (current) use of aspirin: Secondary | ICD-10-CM | POA: Diagnosis not present

## 2021-02-07 DIAGNOSIS — Z9981 Dependence on supplemental oxygen: Secondary | ICD-10-CM | POA: Diagnosis not present

## 2021-02-07 DIAGNOSIS — U071 COVID-19: Secondary | ICD-10-CM | POA: Diagnosis not present

## 2021-02-07 DIAGNOSIS — Z7982 Long term (current) use of aspirin: Secondary | ICD-10-CM | POA: Diagnosis not present

## 2021-02-07 DIAGNOSIS — J1282 Pneumonia due to coronavirus disease 2019: Secondary | ICD-10-CM | POA: Diagnosis not present

## 2021-02-07 DIAGNOSIS — I10 Essential (primary) hypertension: Secondary | ICD-10-CM | POA: Diagnosis not present

## 2021-02-07 DIAGNOSIS — Z7901 Long term (current) use of anticoagulants: Secondary | ICD-10-CM | POA: Diagnosis not present

## 2021-02-12 DIAGNOSIS — U071 COVID-19: Secondary | ICD-10-CM | POA: Diagnosis not present

## 2021-02-12 DIAGNOSIS — Z7901 Long term (current) use of anticoagulants: Secondary | ICD-10-CM | POA: Diagnosis not present

## 2021-02-12 DIAGNOSIS — Z9981 Dependence on supplemental oxygen: Secondary | ICD-10-CM | POA: Diagnosis not present

## 2021-02-12 DIAGNOSIS — I10 Essential (primary) hypertension: Secondary | ICD-10-CM | POA: Diagnosis not present

## 2021-02-12 DIAGNOSIS — J1282 Pneumonia due to coronavirus disease 2019: Secondary | ICD-10-CM | POA: Diagnosis not present

## 2021-02-12 DIAGNOSIS — Z7982 Long term (current) use of aspirin: Secondary | ICD-10-CM | POA: Diagnosis not present

## 2021-02-14 DIAGNOSIS — Z23 Encounter for immunization: Secondary | ICD-10-CM | POA: Diagnosis not present

## 2021-03-06 DIAGNOSIS — I10 Essential (primary) hypertension: Secondary | ICD-10-CM | POA: Diagnosis not present

## 2021-03-06 DIAGNOSIS — I872 Venous insufficiency (chronic) (peripheral): Secondary | ICD-10-CM | POA: Diagnosis not present

## 2021-06-10 DIAGNOSIS — I872 Venous insufficiency (chronic) (peripheral): Secondary | ICD-10-CM | POA: Diagnosis not present

## 2021-06-10 DIAGNOSIS — K219 Gastro-esophageal reflux disease without esophagitis: Secondary | ICD-10-CM | POA: Diagnosis not present

## 2021-06-10 DIAGNOSIS — I10 Essential (primary) hypertension: Secondary | ICD-10-CM | POA: Diagnosis not present

## 2021-06-25 DIAGNOSIS — Z780 Asymptomatic menopausal state: Secondary | ICD-10-CM | POA: Diagnosis not present

## 2021-06-25 DIAGNOSIS — M810 Age-related osteoporosis without current pathological fracture: Secondary | ICD-10-CM | POA: Diagnosis not present

## 2021-06-25 DIAGNOSIS — M85852 Other specified disorders of bone density and structure, left thigh: Secondary | ICD-10-CM | POA: Diagnosis not present

## 2021-09-26 DIAGNOSIS — K219 Gastro-esophageal reflux disease without esophagitis: Secondary | ICD-10-CM | POA: Diagnosis not present

## 2021-09-26 DIAGNOSIS — I10 Essential (primary) hypertension: Secondary | ICD-10-CM | POA: Diagnosis not present

## 2021-09-26 DIAGNOSIS — I872 Venous insufficiency (chronic) (peripheral): Secondary | ICD-10-CM | POA: Diagnosis not present

## 2021-09-26 DIAGNOSIS — E7849 Other hyperlipidemia: Secondary | ICD-10-CM | POA: Diagnosis not present

## 2021-12-02 IMAGING — DX DG CHEST 1V PORT
1 series · 1 of 1 positions shown · non-contrast
Comparison: None.

CLINICAL DATA: Shortness of breath

EXAM:
PORTABLE CHEST 1 VIEW

[chest ap]
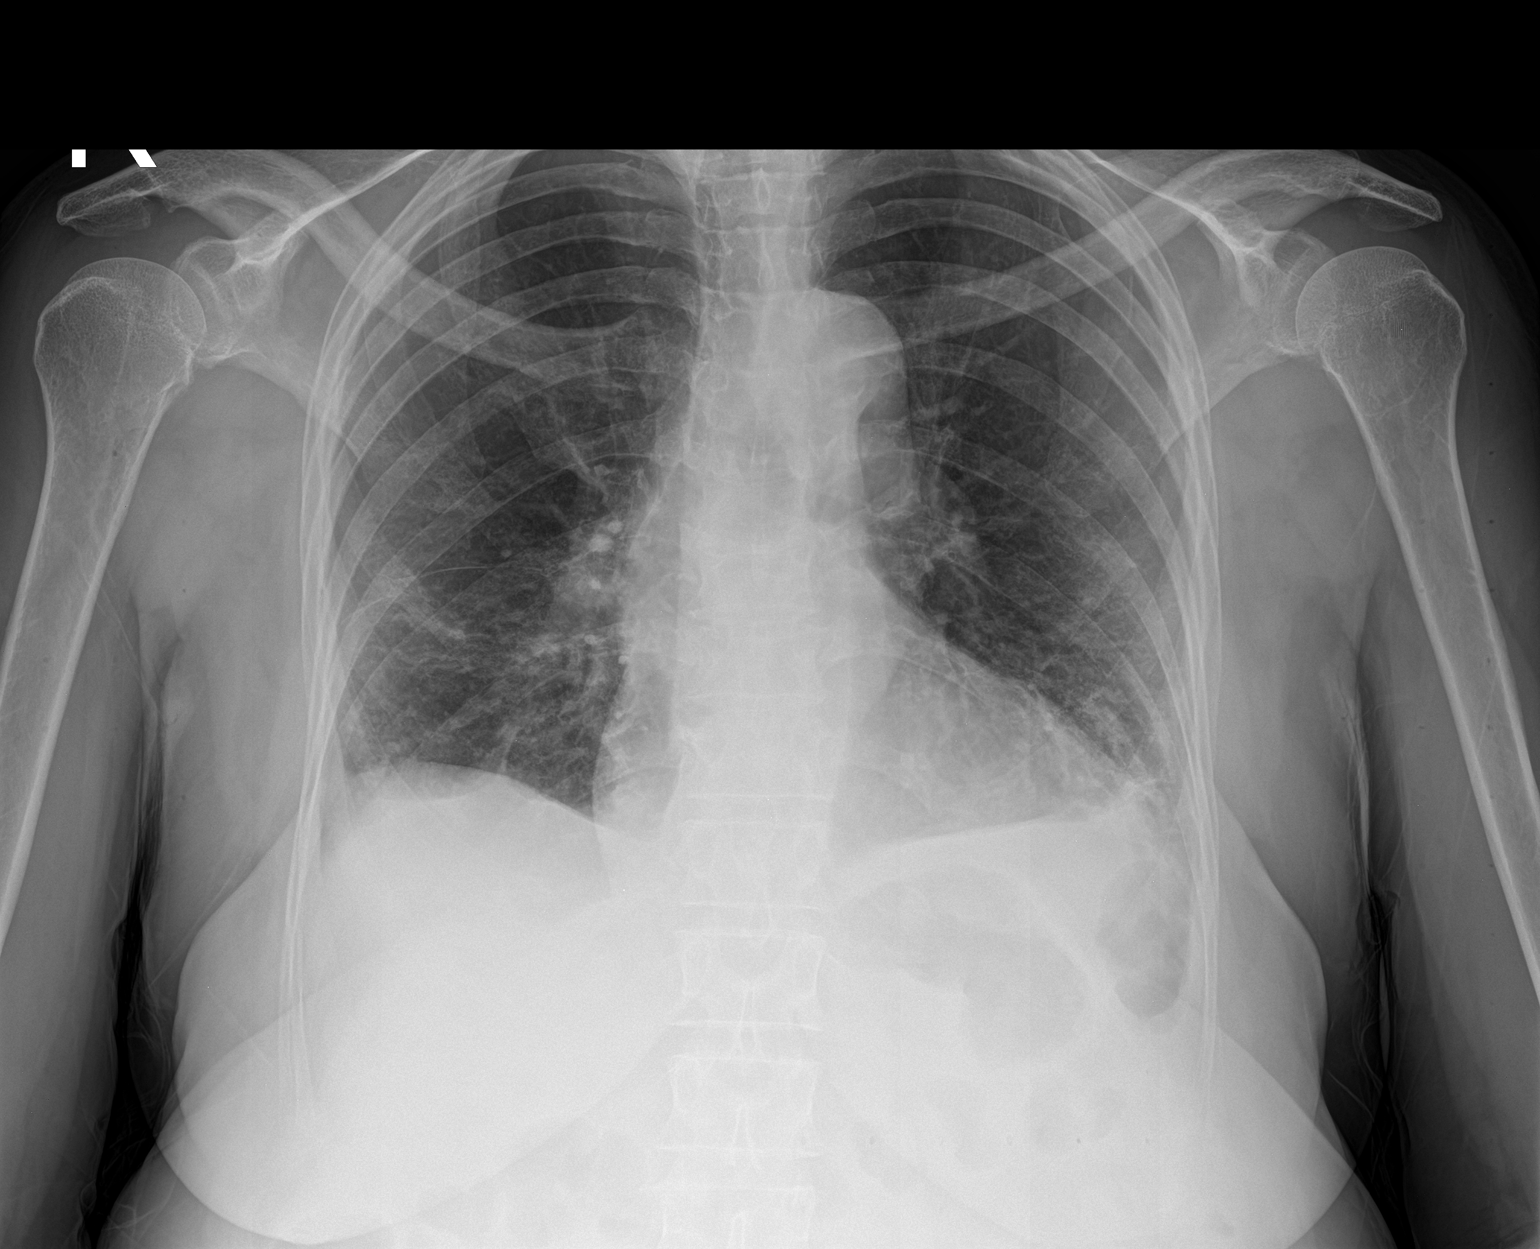

[1 of 1 positions shown; findings below may reference images not displayed]

FINDINGS: Small right pleural effusion and bibasilar atelectasis. No
pneumothorax. Normal cardiomediastinal contours. No focal airspace
consolidation.
IMPRESSION: Small right pleural effusion and bibasilar atelectasis.

## 2021-12-25 DIAGNOSIS — K219 Gastro-esophageal reflux disease without esophagitis: Secondary | ICD-10-CM | POA: Diagnosis not present

## 2021-12-25 DIAGNOSIS — I872 Venous insufficiency (chronic) (peripheral): Secondary | ICD-10-CM | POA: Diagnosis not present

## 2021-12-25 DIAGNOSIS — E7849 Other hyperlipidemia: Secondary | ICD-10-CM | POA: Diagnosis not present

## 2021-12-25 DIAGNOSIS — I10 Essential (primary) hypertension: Secondary | ICD-10-CM | POA: Diagnosis not present

## 2022-03-26 DIAGNOSIS — Z1331 Encounter for screening for depression: Secondary | ICD-10-CM | POA: Diagnosis not present

## 2022-03-26 DIAGNOSIS — E7849 Other hyperlipidemia: Secondary | ICD-10-CM | POA: Diagnosis not present

## 2022-03-26 DIAGNOSIS — K219 Gastro-esophageal reflux disease without esophagitis: Secondary | ICD-10-CM | POA: Diagnosis not present

## 2022-03-26 DIAGNOSIS — Z0000 Encounter for general adult medical examination without abnormal findings: Secondary | ICD-10-CM | POA: Diagnosis not present

## 2022-03-26 DIAGNOSIS — I872 Venous insufficiency (chronic) (peripheral): Secondary | ICD-10-CM | POA: Diagnosis not present

## 2022-03-26 DIAGNOSIS — I10 Essential (primary) hypertension: Secondary | ICD-10-CM | POA: Diagnosis not present

## 2022-07-08 DIAGNOSIS — K219 Gastro-esophageal reflux disease without esophagitis: Secondary | ICD-10-CM | POA: Diagnosis not present

## 2022-07-08 DIAGNOSIS — I10 Essential (primary) hypertension: Secondary | ICD-10-CM | POA: Diagnosis not present

## 2022-07-08 DIAGNOSIS — E7849 Other hyperlipidemia: Secondary | ICD-10-CM | POA: Diagnosis not present

## 2022-07-08 DIAGNOSIS — I872 Venous insufficiency (chronic) (peripheral): Secondary | ICD-10-CM | POA: Diagnosis not present

## 2022-10-13 DIAGNOSIS — K219 Gastro-esophageal reflux disease without esophagitis: Secondary | ICD-10-CM | POA: Diagnosis not present

## 2022-10-13 DIAGNOSIS — I872 Venous insufficiency (chronic) (peripheral): Secondary | ICD-10-CM | POA: Diagnosis not present

## 2022-10-13 DIAGNOSIS — E7849 Other hyperlipidemia: Secondary | ICD-10-CM | POA: Diagnosis not present

## 2022-10-13 DIAGNOSIS — I10 Essential (primary) hypertension: Secondary | ICD-10-CM | POA: Diagnosis not present

## 2023-01-19 DIAGNOSIS — I10 Essential (primary) hypertension: Secondary | ICD-10-CM | POA: Diagnosis not present

## 2023-01-19 DIAGNOSIS — I872 Venous insufficiency (chronic) (peripheral): Secondary | ICD-10-CM | POA: Diagnosis not present

## 2023-01-19 DIAGNOSIS — K219 Gastro-esophageal reflux disease without esophagitis: Secondary | ICD-10-CM | POA: Diagnosis not present

## 2023-01-19 DIAGNOSIS — E7849 Other hyperlipidemia: Secondary | ICD-10-CM | POA: Diagnosis not present

## 2023-01-19 DIAGNOSIS — N182 Chronic kidney disease, stage 2 (mild): Secondary | ICD-10-CM | POA: Diagnosis not present
